# Patient Record
Sex: Male | Born: 1935 | Race: White | Hispanic: No | Marital: Married | Smoking: Never smoker
Health system: Southern US, Community
[De-identification: ages and names within clinical notes are randomized; demographics above are authoritative.]

## PROBLEM LIST (undated history)

## (undated) DIAGNOSIS — I209 Angina pectoris, unspecified: Secondary | ICD-10-CM

## (undated) DIAGNOSIS — N529 Male erectile dysfunction, unspecified: Secondary | ICD-10-CM

## (undated) DIAGNOSIS — Z860100 Personal history of colon polyps, unspecified: Secondary | ICD-10-CM

## (undated) DIAGNOSIS — E785 Hyperlipidemia, unspecified: Secondary | ICD-10-CM

## (undated) DIAGNOSIS — M1990 Unspecified osteoarthritis, unspecified site: Secondary | ICD-10-CM

## (undated) DIAGNOSIS — I10 Essential (primary) hypertension: Secondary | ICD-10-CM

## (undated) DIAGNOSIS — R350 Frequency of micturition: Secondary | ICD-10-CM

## (undated) DIAGNOSIS — I2510 Atherosclerotic heart disease of native coronary artery without angina pectoris: Secondary | ICD-10-CM

## (undated) DIAGNOSIS — I495 Sick sinus syndrome: Secondary | ICD-10-CM

## (undated) DIAGNOSIS — R413 Other amnesia: Secondary | ICD-10-CM

## (undated) DIAGNOSIS — M353 Polymyalgia rheumatica: Secondary | ICD-10-CM

## (undated) DIAGNOSIS — N400 Enlarged prostate without lower urinary tract symptoms: Secondary | ICD-10-CM

## (undated) DIAGNOSIS — L719 Rosacea, unspecified: Secondary | ICD-10-CM

## (undated) DIAGNOSIS — N200 Calculus of kidney: Secondary | ICD-10-CM

## (undated) DIAGNOSIS — K5790 Diverticulosis of intestine, part unspecified, without perforation or abscess without bleeding: Secondary | ICD-10-CM

## (undated) DIAGNOSIS — R351 Nocturia: Secondary | ICD-10-CM

## (undated) DIAGNOSIS — N402 Nodular prostate without lower urinary tract symptoms: Secondary | ICD-10-CM

## (undated) DIAGNOSIS — Z8719 Personal history of other diseases of the digestive system: Secondary | ICD-10-CM

## (undated) DIAGNOSIS — R972 Elevated prostate specific antigen [PSA]: Secondary | ICD-10-CM

## (undated) HISTORY — DX: Rosacea, unspecified: L71.9

## (undated) HISTORY — DX: Frequency of micturition: R35.0

## (undated) HISTORY — DX: Male erectile dysfunction, unspecified: N52.9

## (undated) HISTORY — DX: Elevated prostate specific antigen (PSA): R97.20

## (undated) HISTORY — DX: Benign prostatic hyperplasia without lower urinary tract symptoms: N40.0

## (undated) HISTORY — DX: Diverticulosis of intestine, part unspecified, without perforation or abscess without bleeding: K57.90

## (undated) HISTORY — DX: Nodular prostate without lower urinary tract symptoms: N40.2

## (undated) HISTORY — DX: Hyperlipidemia, unspecified: E78.5

## (undated) HISTORY — DX: Polymyalgia rheumatica: M35.3

## (undated) HISTORY — PX: TOTAL KNEE ARTHROPLASTY: SHX125

## (undated) HISTORY — PX: CORONARY ANGIOPLASTY WITH STENT PLACEMENT: SHX49

## (undated) HISTORY — DX: Calculus of kidney: N20.0

## (undated) HISTORY — DX: Other amnesia: R41.3

## (undated) HISTORY — DX: Personal history of colonic polyps: Z86.010

## (undated) HISTORY — PX: HIP SURGERY: SHX245

## (undated) HISTORY — PX: CHOLECYSTECTOMY: SHX55

## (undated) HISTORY — DX: Personal history of other diseases of the digestive system: Z87.19

## (undated) HISTORY — DX: Angina pectoris, unspecified: I20.9

## (undated) HISTORY — DX: Sick sinus syndrome: I49.5

## (undated) HISTORY — DX: Unspecified osteoarthritis, unspecified site: M19.90

## (undated) HISTORY — DX: Nocturia: R35.1

## (undated) HISTORY — DX: Personal history of colon polyps, unspecified: Z86.0100

## (undated) HISTORY — DX: Essential (primary) hypertension: I10

## (undated) HISTORY — DX: Atherosclerotic heart disease of native coronary artery without angina pectoris: I25.10

## (undated) HISTORY — PX: TOTAL HIP ARTHROPLASTY: SHX124

---

## 2004-11-29 ENCOUNTER — Ambulatory Visit: Payer: Self-pay | Admitting: Gastroenterology

## 2005-03-11 ENCOUNTER — Ambulatory Visit: Payer: Self-pay | Admitting: Ophthalmology

## 2005-09-16 ENCOUNTER — Other Ambulatory Visit: Payer: Self-pay

## 2005-09-25 ENCOUNTER — Inpatient Hospital Stay: Payer: Self-pay | Admitting: General Practice

## 2005-12-02 ENCOUNTER — Ambulatory Visit: Payer: Self-pay | Admitting: Ophthalmology

## 2006-04-01 ENCOUNTER — Inpatient Hospital Stay: Payer: Self-pay | Admitting: General Practice

## 2006-07-07 ENCOUNTER — Ambulatory Visit: Payer: Self-pay | Admitting: General Practice

## 2006-11-05 ENCOUNTER — Inpatient Hospital Stay: Payer: Self-pay | Admitting: Internal Medicine

## 2006-11-05 ENCOUNTER — Other Ambulatory Visit: Payer: Self-pay

## 2006-11-07 ENCOUNTER — Other Ambulatory Visit: Payer: Self-pay

## 2008-02-04 ENCOUNTER — Ambulatory Visit: Payer: Self-pay | Admitting: Gastroenterology

## 2012-04-14 ENCOUNTER — Ambulatory Visit: Payer: Self-pay | Admitting: Gastroenterology

## 2012-04-17 LAB — PATHOLOGY REPORT

## 2012-06-23 ENCOUNTER — Ambulatory Visit: Payer: Self-pay | Admitting: Internal Medicine

## 2013-02-08 DIAGNOSIS — N138 Other obstructive and reflux uropathy: Secondary | ICD-10-CM | POA: Insufficient documentation

## 2013-08-05 HISTORY — PX: HEEL SPUR SURGERY: SHX665

## 2013-12-15 ENCOUNTER — Encounter: Payer: Self-pay | Admitting: Rheumatology

## 2014-01-03 ENCOUNTER — Encounter: Payer: Self-pay | Admitting: Rheumatology

## 2014-02-07 ENCOUNTER — Emergency Department: Payer: Self-pay | Admitting: Emergency Medicine

## 2014-02-08 DIAGNOSIS — I2510 Atherosclerotic heart disease of native coronary artery without angina pectoris: Secondary | ICD-10-CM | POA: Insufficient documentation

## 2014-02-08 DIAGNOSIS — K219 Gastro-esophageal reflux disease without esophagitis: Secondary | ICD-10-CM | POA: Insufficient documentation

## 2014-02-15 DIAGNOSIS — E782 Mixed hyperlipidemia: Secondary | ICD-10-CM | POA: Insufficient documentation

## 2014-09-01 DIAGNOSIS — R001 Bradycardia, unspecified: Secondary | ICD-10-CM | POA: Insufficient documentation

## 2015-02-22 DIAGNOSIS — I10 Essential (primary) hypertension: Secondary | ICD-10-CM | POA: Insufficient documentation

## 2015-03-15 ENCOUNTER — Other Ambulatory Visit: Payer: Self-pay | Admitting: Orthopedic Surgery

## 2015-03-15 ENCOUNTER — Ambulatory Visit
Admission: RE | Admit: 2015-03-15 | Discharge: 2015-03-15 | Disposition: A | Discharge status: Home / Self Care | Payer: Medicare Other | Source: Ambulatory Visit | Attending: Orthopedic Surgery | Admitting: Orthopedic Surgery

## 2015-03-15 DIAGNOSIS — K4090 Unilateral inguinal hernia, without obstruction or gangrene, not specified as recurrent: Secondary | ICD-10-CM | POA: Insufficient documentation

## 2015-03-15 DIAGNOSIS — I2510 Atherosclerotic heart disease of native coronary artery without angina pectoris: Secondary | ICD-10-CM | POA: Diagnosis not present

## 2015-03-15 DIAGNOSIS — R102 Pelvic and perineal pain: Secondary | ICD-10-CM

## 2015-03-15 DIAGNOSIS — I7090 Unspecified atherosclerosis: Secondary | ICD-10-CM | POA: Diagnosis not present

## 2015-03-15 DIAGNOSIS — Z96642 Presence of left artificial hip joint: Secondary | ICD-10-CM | POA: Insufficient documentation

## 2015-03-15 MED ORDER — IOHEXOL 300 MG/ML  SOLN
100.0000 mL | Freq: Once | INTRAMUSCULAR | Status: AC | PRN
  Administered 2015-03-15: 100 mL via INTRAVENOUS

## 2015-08-08 ENCOUNTER — Encounter: Payer: Self-pay | Admitting: *Deleted

## 2015-08-08 DIAGNOSIS — F0280 Dementia in other diseases classified elsewhere without behavioral disturbance: Secondary | ICD-10-CM | POA: Insufficient documentation

## 2015-08-08 DIAGNOSIS — G301 Alzheimer's disease with late onset: Secondary | ICD-10-CM

## 2015-08-14 ENCOUNTER — Ambulatory Visit: Payer: Self-pay | Admitting: Urology

## 2015-12-29 ENCOUNTER — Encounter
Admission: RE | Admit: 2015-12-29 | Discharge: 2015-12-29 | Disposition: A | Discharge status: Home / Self Care | Payer: Medicare Other | Source: Ambulatory Visit | Attending: Internal Medicine | Admitting: Internal Medicine

## 2015-12-29 DIAGNOSIS — R270 Ataxia, unspecified: Secondary | ICD-10-CM | POA: Diagnosis present

## 2015-12-29 DIAGNOSIS — R410 Disorientation, unspecified: Secondary | ICD-10-CM | POA: Diagnosis not present

## 2015-12-29 DIAGNOSIS — Z79899 Other long term (current) drug therapy: Secondary | ICD-10-CM | POA: Diagnosis not present

## 2015-12-29 LAB — COMPREHENSIVE METABOLIC PANEL
ALBUMIN: 4 g/dL (ref 3.5–5.0)
ALK PHOS: 55 U/L (ref 38–126)
ALT: 13 U/L — ABNORMAL LOW (ref 17–63)
AST: 21 U/L (ref 15–41)
Anion gap: 7 (ref 5–15)
BUN: 12 mg/dL (ref 6–20)
CALCIUM: 8.7 mg/dL — AB (ref 8.9–10.3)
CO2: 29 mmol/L (ref 22–32)
Chloride: 99 mmol/L — ABNORMAL LOW (ref 101–111)
Creatinine, Ser: 0.69 mg/dL (ref 0.61–1.24)
GFR calc Af Amer: >60 mL/min (ref >60)
GLUCOSE: 100 mg/dL — AB (ref 65–99)
POTASSIUM: 3.7 mmol/L (ref 3.5–5.1)
Sodium: 135 mmol/L (ref 135–145)
TOTAL PROTEIN: 6.6 g/dL (ref 6.5–8.1)
Total Bilirubin: 0.7 mg/dL (ref 0.3–1.2)

## 2015-12-29 LAB — VALPROIC ACID LEVEL: Valproic Acid Lvl: 24 ug/mL — ABNORMAL LOW (ref 50.0–100.0)

## 2015-12-29 LAB — CBC WITH DIFFERENTIAL/PLATELET
BASOS ABS: 0 10*3/uL (ref 0–0.1)
BASOS PCT: 0 %
Eosinophils Absolute: 0.1 10*3/uL (ref 0–0.7)
Eosinophils Relative: 2 %
HEMATOCRIT: 38.7 % — AB (ref 40.0–52.0)
HEMOGLOBIN: 13.2 g/dL (ref 13.0–18.0)
LYMPHS PCT: 27 %
Lymphs Abs: 1.3 10*3/uL (ref 1.0–3.6)
MCH: 30.6 pg (ref 26.0–34.0)
MCHC: 34 g/dL (ref 32.0–36.0)
MCV: 89.9 fL (ref 80.0–100.0)
MONO ABS: 0.3 10*3/uL (ref 0.2–1.0)
MONOS PCT: 6 %
NEUTROS ABS: 3.2 10*3/uL (ref 1.4–6.5)
NEUTROS PCT: 65 %
Platelets: 154 10*3/uL (ref 150–440)
RBC: 4.3 MIL/uL — ABNORMAL LOW (ref 4.40–5.90)
RDW: 13.4 % (ref 11.5–14.5)
WBC: 5 10*3/uL (ref 3.8–10.6)

## 2015-12-29 LAB — VITAMIN B12: VITAMIN B 12: 449 pg/mL (ref 180–914)

## 2015-12-29 LAB — TSH: TSH: 1.098 u[IU]/mL (ref 0.350–4.500)

## 2016-01-04 ENCOUNTER — Encounter
Admission: RE | Admit: 2016-01-04 | Discharge: 2016-01-04 | Disposition: A | Discharge status: Home / Self Care | Payer: Medicare Other | Source: Ambulatory Visit | Attending: Internal Medicine | Admitting: Internal Medicine

## 2016-01-04 DIAGNOSIS — G9340 Encephalopathy, unspecified: Secondary | ICD-10-CM | POA: Insufficient documentation

## 2016-01-04 DIAGNOSIS — E871 Hypo-osmolality and hyponatremia: Secondary | ICD-10-CM | POA: Insufficient documentation

## 2016-01-04 DIAGNOSIS — R410 Disorientation, unspecified: Secondary | ICD-10-CM | POA: Insufficient documentation

## 2016-01-26 DIAGNOSIS — R410 Disorientation, unspecified: Secondary | ICD-10-CM | POA: Diagnosis not present

## 2016-01-26 DIAGNOSIS — G9340 Encephalopathy, unspecified: Secondary | ICD-10-CM | POA: Diagnosis not present

## 2016-01-26 DIAGNOSIS — E871 Hypo-osmolality and hyponatremia: Secondary | ICD-10-CM | POA: Diagnosis not present

## 2016-01-26 LAB — URINALYSIS COMPLETE WITH MICROSCOPIC (ARMC ONLY)
BILIRUBIN URINE: NEGATIVE
Bacteria, UA: NONE SEEN
GLUCOSE, UA: NEGATIVE mg/dL
Ketones, ur: NEGATIVE mg/dL
LEUKOCYTES UA: NEGATIVE
Nitrite: NEGATIVE
PH: 5 (ref 5.0–8.0)
PROTEIN: NEGATIVE mg/dL
SQUAMOUS EPITHELIAL / LPF: NONE SEEN
Specific Gravity, Urine: 1.017 (ref 1.005–1.030)

## 2016-01-26 LAB — CBC WITH DIFFERENTIAL/PLATELET
BASOS ABS: 0 10*3/uL (ref 0–0.1)
BASOS PCT: 0 %
EOS ABS: 0.1 10*3/uL (ref 0–0.7)
Eosinophils Relative: 2 %
HEMATOCRIT: 37.2 % — AB (ref 40.0–52.0)
HEMOGLOBIN: 13.1 g/dL (ref 13.0–18.0)
Lymphocytes Relative: 20 %
Lymphs Abs: 1.6 10*3/uL (ref 1.0–3.6)
MCH: 31.1 pg (ref 26.0–34.0)
MCHC: 35.3 g/dL (ref 32.0–36.0)
MCV: 88 fL (ref 80.0–100.0)
MONO ABS: 0.8 10*3/uL (ref 0.2–1.0)
Monocytes Relative: 10 %
NEUTROS ABS: 5.4 10*3/uL (ref 1.4–6.5)
NEUTROS PCT: 68 %
Platelets: 180 10*3/uL (ref 150–440)
RBC: 4.22 MIL/uL — ABNORMAL LOW (ref 4.40–5.90)
RDW: 13 % (ref 11.5–14.5)
WBC: 7.9 10*3/uL (ref 3.8–10.6)

## 2016-01-26 LAB — COMPREHENSIVE METABOLIC PANEL
ALBUMIN: 3.7 g/dL (ref 3.5–5.0)
ALT: 19 U/L (ref 17–63)
ANION GAP: 10 (ref 5–15)
AST: 17 U/L (ref 15–41)
Alkaline Phosphatase: 73 U/L (ref 38–126)
BILIRUBIN TOTAL: 0.9 mg/dL (ref 0.3–1.2)
BUN: 13 mg/dL (ref 6–20)
CO2: 24 mmol/L (ref 22–32)
Calcium: 8.6 mg/dL — ABNORMAL LOW (ref 8.9–10.3)
Chloride: 95 mmol/L — ABNORMAL LOW (ref 101–111)
Creatinine, Ser: 0.58 mg/dL — ABNORMAL LOW (ref 0.61–1.24)
GFR calc Af Amer: >60 mL/min (ref >60)
GFR calc non Af Amer: >60 mL/min (ref >60)
GLUCOSE: 124 mg/dL — AB (ref 65–99)
POTASSIUM: 4 mmol/L (ref 3.5–5.1)
SODIUM: 129 mmol/L — AB (ref 135–145)
TOTAL PROTEIN: 6.5 g/dL (ref 6.5–8.1)

## 2016-01-27 LAB — URINE CULTURE

## 2016-01-30 DIAGNOSIS — E871 Hypo-osmolality and hyponatremia: Secondary | ICD-10-CM | POA: Diagnosis present

## 2016-01-30 DIAGNOSIS — G9340 Encephalopathy, unspecified: Secondary | ICD-10-CM | POA: Diagnosis not present

## 2016-01-30 DIAGNOSIS — R410 Disorientation, unspecified: Secondary | ICD-10-CM | POA: Diagnosis not present

## 2016-01-30 LAB — AMMONIA: AMMONIA: 39 umol/L — AB (ref 9–35)

## 2016-01-31 ENCOUNTER — Non-Acute Institutional Stay (SKILLED_NURSING_FACILITY): Payer: Medicare Other | Admitting: Gerontology

## 2016-01-31 DIAGNOSIS — R058 Other specified cough: Secondary | ICD-10-CM

## 2016-01-31 DIAGNOSIS — K4091 Unilateral inguinal hernia, without obstruction or gangrene, recurrent: Secondary | ICD-10-CM

## 2016-01-31 DIAGNOSIS — R05 Cough: Secondary | ICD-10-CM

## 2016-02-01 DIAGNOSIS — E871 Hypo-osmolality and hyponatremia: Secondary | ICD-10-CM | POA: Diagnosis not present

## 2016-02-01 DIAGNOSIS — K4091 Unilateral inguinal hernia, without obstruction or gangrene, recurrent: Secondary | ICD-10-CM | POA: Insufficient documentation

## 2016-02-01 DIAGNOSIS — R410 Disorientation, unspecified: Secondary | ICD-10-CM | POA: Diagnosis not present

## 2016-02-01 DIAGNOSIS — G9340 Encephalopathy, unspecified: Secondary | ICD-10-CM | POA: Diagnosis not present

## 2016-02-01 LAB — CBC WITH DIFFERENTIAL/PLATELET
BASOS PCT: 1 %
Basophils Absolute: 0.1 10*3/uL (ref 0–0.1)
EOS ABS: 0.3 10*3/uL (ref 0–0.7)
Eosinophils Relative: 6 %
HEMATOCRIT: 34.7 % — AB (ref 40.0–52.0)
HEMOGLOBIN: 12.1 g/dL — AB (ref 13.0–18.0)
LYMPHS ABS: 1.3 10*3/uL (ref 1.0–3.6)
Lymphocytes Relative: 26 %
MCH: 31.1 pg (ref 26.0–34.0)
MCHC: 35 g/dL (ref 32.0–36.0)
MCV: 89 fL (ref 80.0–100.0)
MONOS PCT: 8 %
Monocytes Absolute: 0.4 10*3/uL (ref 0.2–1.0)
NEUTROS ABS: 3 10*3/uL (ref 1.4–6.5)
Neutrophils Relative %: 59 %
Platelets: 203 10*3/uL (ref 150–440)
RBC: 3.9 MIL/uL — AB (ref 4.40–5.90)
RDW: 13 % (ref 11.5–14.5)
WBC: 5.1 10*3/uL (ref 3.8–10.6)

## 2016-02-01 LAB — COMPREHENSIVE METABOLIC PANEL
ALBUMIN: 3 g/dL — AB (ref 3.5–5.0)
ALK PHOS: 73 U/L (ref 38–126)
ALT: 24 U/L (ref 17–63)
AST: 21 U/L (ref 15–41)
Anion gap: 5 (ref 5–15)
BILIRUBIN TOTAL: 0.8 mg/dL (ref 0.3–1.2)
BUN: 15 mg/dL (ref 6–20)
CALCIUM: 8.3 mg/dL — AB (ref 8.9–10.3)
CO2: 27 mmol/L (ref 22–32)
CREATININE: 0.59 mg/dL — AB (ref 0.61–1.24)
Chloride: 105 mmol/L (ref 101–111)
GFR calc Af Amer: >60 mL/min (ref >60)
GFR calc non Af Amer: >60 mL/min (ref >60)
GLUCOSE: 89 mg/dL (ref 65–99)
Potassium: 4.1 mmol/L (ref 3.5–5.1)
SODIUM: 137 mmol/L (ref 135–145)
TOTAL PROTEIN: 5.6 g/dL — AB (ref 6.5–8.1)
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
## 2016-02-03 ENCOUNTER — Encounter
Admission: RE | Admit: 2016-02-03 | Discharge: 2016-02-03 | Disposition: A | Discharge status: Home / Self Care | Payer: Medicare Other | Source: Ambulatory Visit | Attending: Internal Medicine | Admitting: Internal Medicine

## 2016-03-05 DEATH — deceased

## 2016-12-02 IMAGING — CT CT ABD-PELV W/ CM
1 of 3 series · 13 of 32 positions shown, 18 images · IV contrast (omnipaque)
Comparison: Report of 12/16/2003

CLINICAL DATA: Suprapubic pain for 5 days. Pelvic pain. Evaluate
for pelvic mass.

EXAM:
CT ABDOMEN AND PELVIS WITH CONTRAST
TECHNIQUE: Multidetector CT imaging of the abdomen and pelvis was performed
using the standard protocol following bolus administration of
intravenous contrast.
CONTRAST:  100mL OMNIPAQUE IOHEXOL 300 MG/ML  SOLN

[Series 2: routine abd pel with · axial · 0.76mm/px · z∈[-381,+34]mm · 13 of 95 slices shown, 18 images]
[im 6/95  soft-tissue]
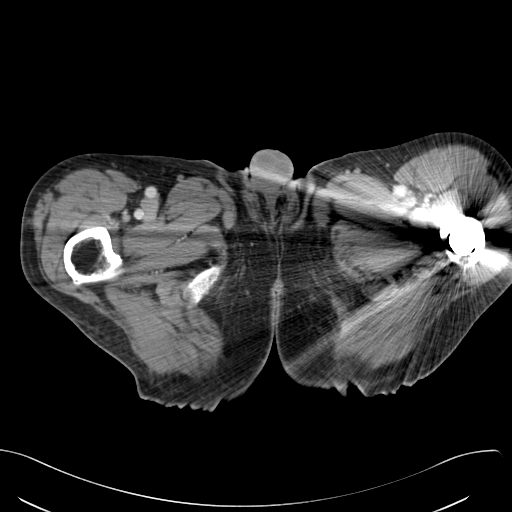
[im 6/95  bone]
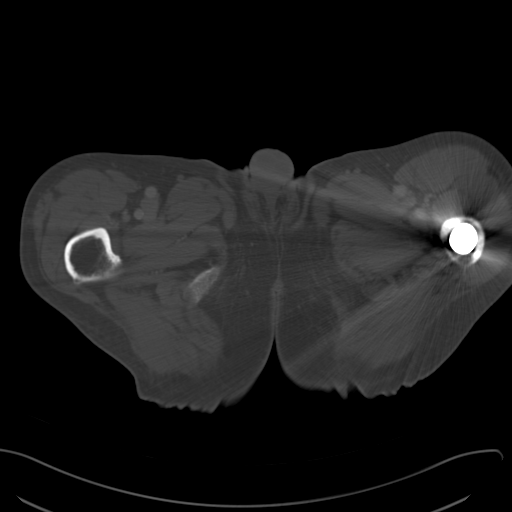
[im 16/95  soft-tissue]
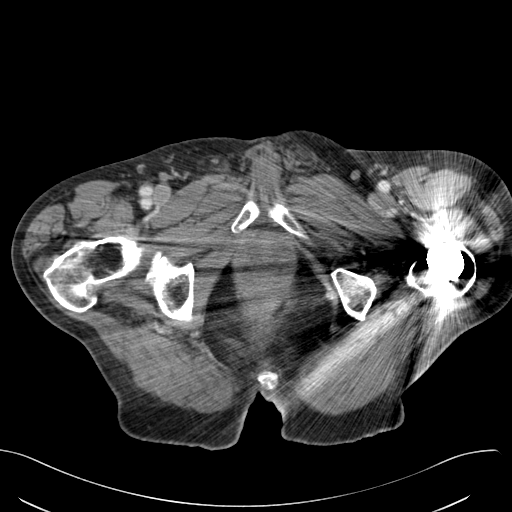
[im 21/95  soft-tissue]
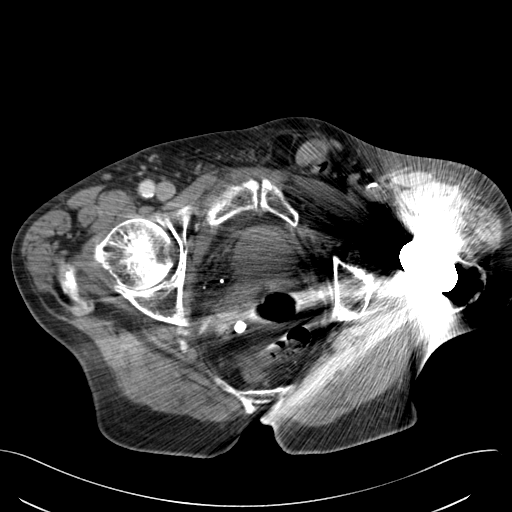
[im 27/95  soft-tissue]
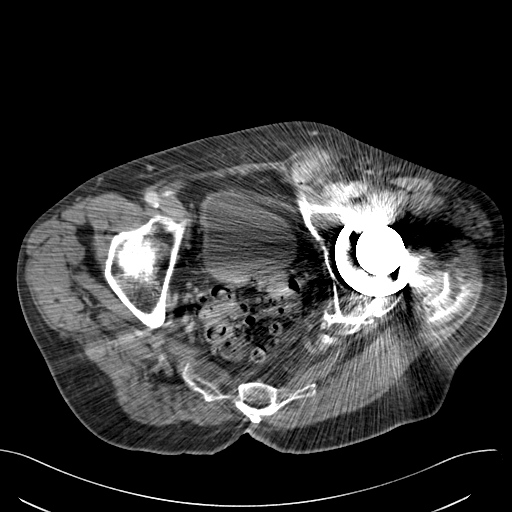
[im 37/95  soft-tissue]
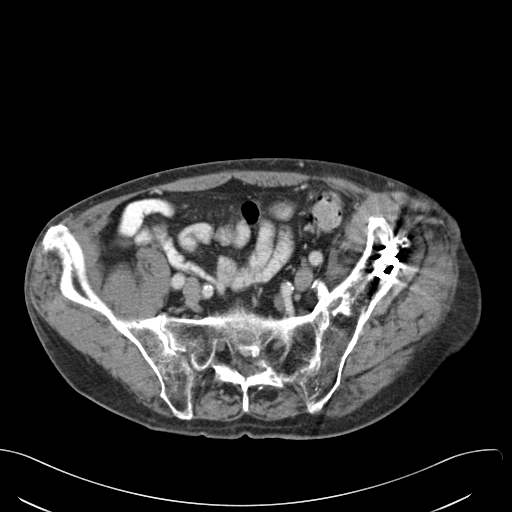
[im 42/95  soft-tissue]
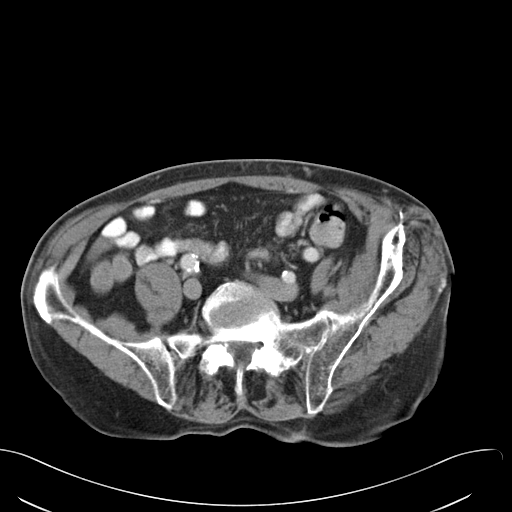
[im 53/95  soft-tissue]
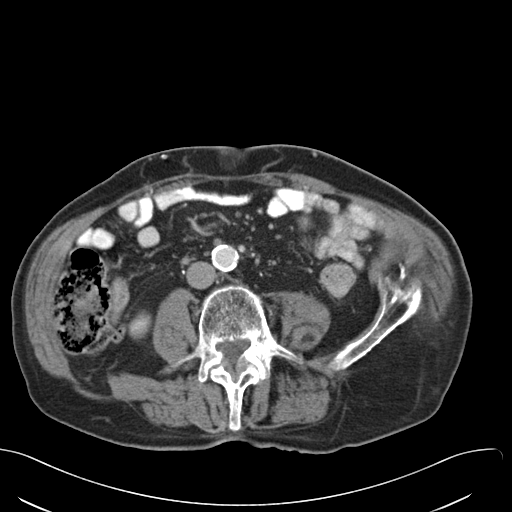
[im 58/95  soft-tissue]
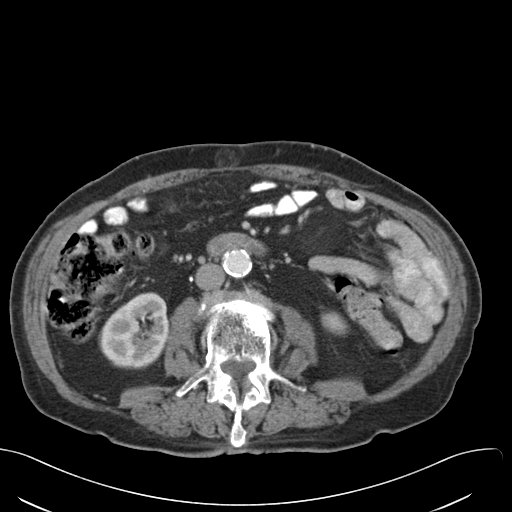
[im 68/95  soft-tissue]
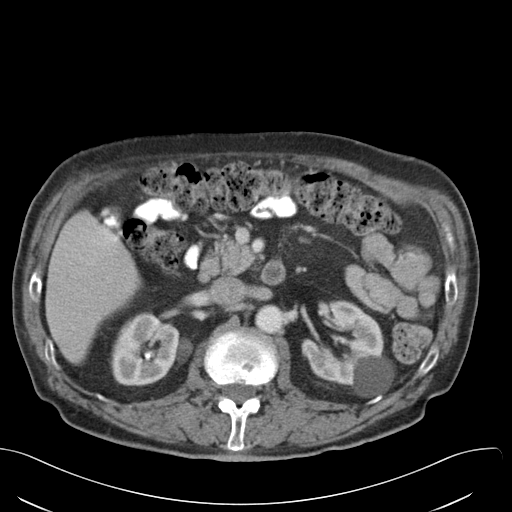
[im 68/95  bone]
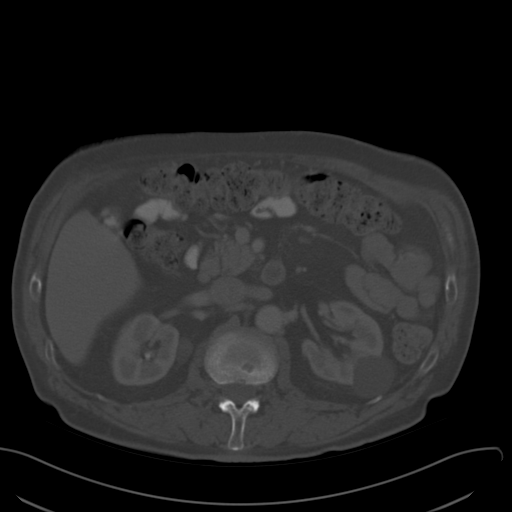
[im 74/95  soft-tissue]
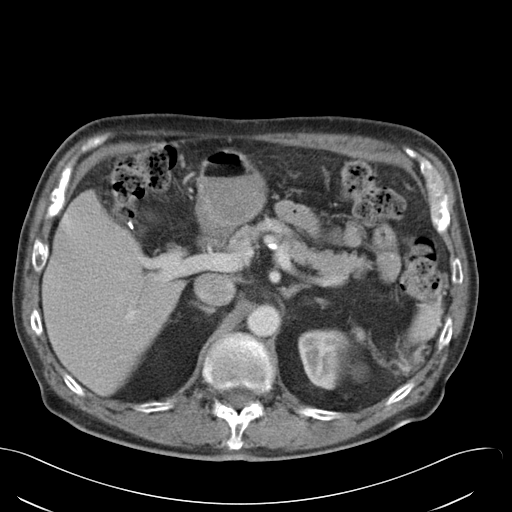
[im 74/95  lung]
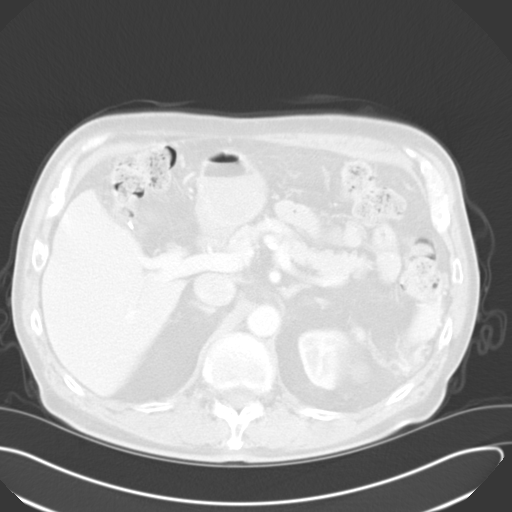
[im 79/95  soft-tissue]
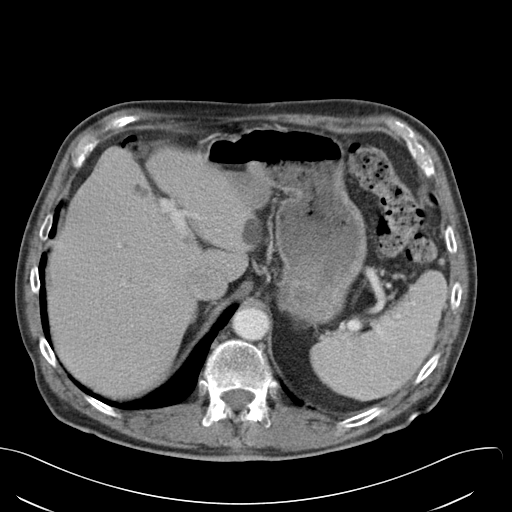
[im 79/95  lung]
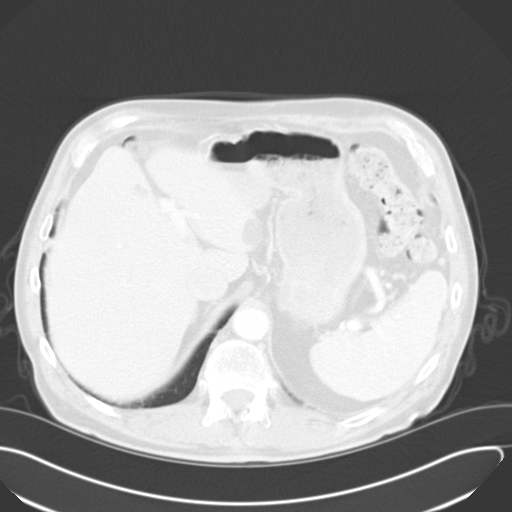
[im 84/95  lung]
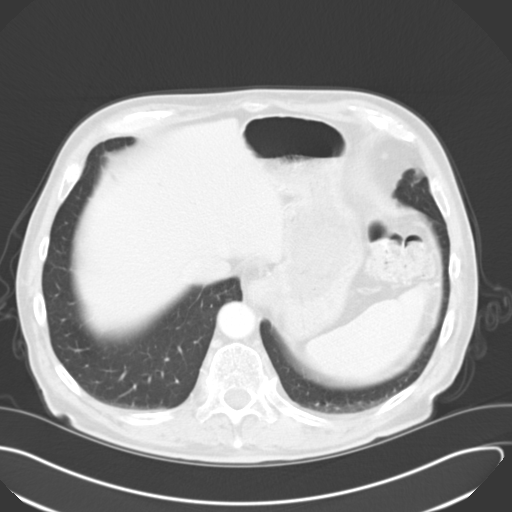
[im 89/95  soft-tissue]
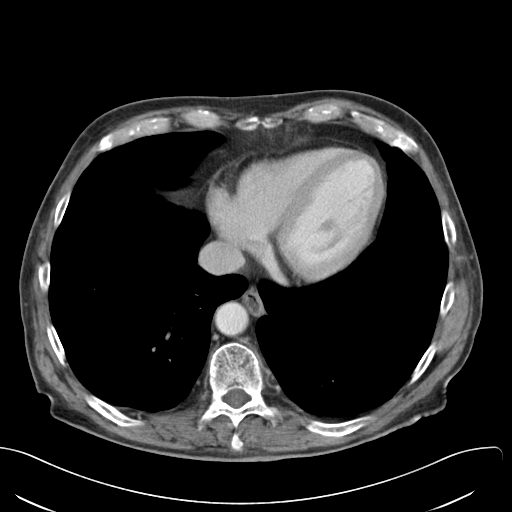
[im 89/95  lung]
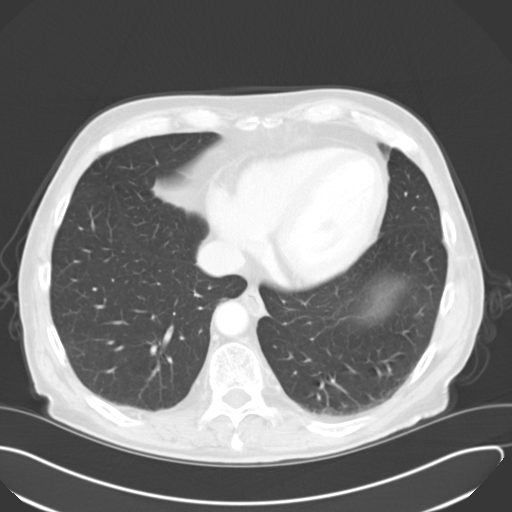

[13 of 32 positions shown; findings below may reference images not displayed]

FINDINGS: Lower chest: Clear lung bases. Normal heart size without pericardial
or pleural effusion. Right coronary artery atherosclerosis.

Hepatobiliary: Mild motion degradation. Left hepatic lobe cysts.
Cholecystectomy, without biliary ductal dilatation.

Pancreas: Normal, without mass or ductal dilatation.

Spleen: Normal

Adrenals/Urinary Tract: Normal adrenal glands. Bilateral renal
cysts. No hydronephrosis. Degraded evaluation of the pelvis,
secondary to beam hardening artifact from left hip arthroplasty.
Given this factor, grossly normal urinary bladder, without
hydroureter.

Stomach/Bowel: Normal stomach, without wall thickening. Extensive
colonic diverticulosis. Nonobstructive distal descending colon
positioned within a left inguinal hernia, including on image 73. No
evidence of strangulation. Normal terminal ileum and appendix.
Appendix likely identified and normal on image 43. No right lower
quadrant inflammation identified. Normal small bowel.

Vascular/Lymphatic: Aortic and branch vessel atherosclerosis. No
retroperitoneal or retrocrural adenopathy. No convincing evidence of
pelvic adenopathy.

Reproductive: Normal prostate for age.

Other: No significant free fluid. Fat containing right paracentral
umbilical hernia.

Musculoskeletal: Left hip arthroplasty. Osteopenia. Advanced
degenerative disc disease in the mid lumbar spine. T9 vertebral
hemangioma.
IMPRESSION: 1. Mild degradation secondary to motion and beam hardening artifact
from left hip arthroplasty.
2. Left inguinal hernia containing nonobstructive distal descending
colon.
3. No other explanation for pain.
4.  Atherosclerosis, including within the coronary arteries.
